# Patient Record
Sex: Male | Born: 2000 | Race: White | Hispanic: No | Marital: Single | Smoking: Never smoker
Health system: Southern US, Community
[De-identification: ages and names within clinical notes are randomized; demographics above are authoritative.]

## PROBLEM LIST (undated history)

## (undated) DIAGNOSIS — L309 Dermatitis, unspecified: Secondary | ICD-10-CM

## (undated) DIAGNOSIS — L509 Urticaria, unspecified: Secondary | ICD-10-CM

## (undated) DIAGNOSIS — J45990 Exercise induced bronchospasm: Secondary | ICD-10-CM

## (undated) HISTORY — DX: Exercise induced bronchospasm: J45.990

## (undated) HISTORY — DX: Dermatitis, unspecified: L30.9

## (undated) HISTORY — DX: Urticaria, unspecified: L50.9

---
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
## 2018-11-02 ENCOUNTER — Encounter: Payer: Self-pay | Admitting: Psychiatry

## 2018-11-02 ENCOUNTER — Ambulatory Visit (INDEPENDENT_AMBULATORY_CARE_PROVIDER_SITE_OTHER): Payer: 59 | Admitting: Psychiatry

## 2018-11-02 VITALS — BP 132/82 | HR 76 | Ht 66.0 in | Wt 159.0 lb

## 2018-11-02 DIAGNOSIS — F900 Attention-deficit hyperactivity disorder, predominantly inattentive type: Secondary | ICD-10-CM | POA: Diagnosis not present

## 2018-11-02 DIAGNOSIS — F411 Generalized anxiety disorder: Secondary | ICD-10-CM | POA: Diagnosis not present

## 2018-11-02 MED ORDER — METHYLPHENIDATE HCL ER (PM) 40 MG PO CP24: 40.0000 mg | ORAL_CAPSULE | Freq: Every day | ORAL | 0 refills | Status: DC

## 2018-11-02 MED ORDER — ESCITALOPRAM OXALATE 10 MG PO TABS: 10.0000 mg | ORAL_TABLET | Freq: Every day | ORAL | 1 refills | Status: DC

## 2018-11-02 NOTE — Progress Notes (Signed)
[facility]  Patient ID: Perry Frank,  MRN: [id number]  PCP: Maryellen Pileubin, David, MD  Date of Evaluation: 11/02/2018 Time spent:20 minutes  Chief Complaint:  Chief Complaint    Anxiety; ADHD      HISTORY/CURRENT STATUS: Perry Frank is seen conjointly with mother face-to-face with consent without other collateral for psychiatric interview and exam in 241-month evaluation and management of anxiety and mild ADHD.  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  Chemistry may have been his toughest course though he had the most difficulty with morning English at 0900 and the 1010 class as he sleeps late in the morning stating maybe his college bed is just too comfortable.  Mother sets her alarm to attempt to make sure he gets to [place] class as only so many absences are allowed before grade is reduced a letter.  He has otherwise adjusted to campus life with weight stable up 1 pound and regular exercise, though having a series of upper respiratory infections including sore throat and sinus still bothering him though treated with antibiotic in November.  He has been home several times and parents are pleased with his overall course of college participation.  Only since fall break has he tried Evekeo finding the first dose to feel and help great at 10 mg in the morning, but after that it seemed only help him be more alert for test such as finals as though he had had a good amount of caffeine.  Mother worries that he uses alcohol but he does not clarify specifically even though he and mother accept education on the use of alcohol in college when on either SSRI or stimulant.  Mother is much more confident in the patient than last appointment.  He does not have his truck at school but rather parents give him transportation back and forth.  Anxiety  Presents for follow-up visit. Symptoms include decreased concentration, dry mouth, excessive worry,  nervous/anxious behavior, obsessions and restlessness. Patient reports no panic or suicidal ideas. Symptoms occur most days. The most recent episode lasted 10 minutes. The severity of symptoms is mild. The patient sleeps 8 hours per night. The quality of sleep is good. Nighttime awakenings: occasional.   Compliance with medications is 51-75%.    Individual Medical History/ Review of Systems: Changes? :Yes   Some individuation separation is allowed by parents and accepted by patient for college life to become more adult maturity over time.  He has had allergic rhinitis, pharyngitis and sinusitis infections, and the at times burning out recovery systems status of college life for current primary care medical needs.  Allergies: Amoxicillin  Current Medications:  Current Outpatient Medications:  .  escitalopram (LEXAPRO) 10 MG tablet, Take 1 tablet (10 mg total) by mouth at bedtime., Disp: 90 tablet, Rfl: 1 .  acetaminophen (TYLENOL) 325 MG tablet, Take 650 mg by mouth every 6 (six) hours as needed., Disp: , Rfl:  .  albuterol (PROVENTIL HFA;VENTOLIN HFA) 108 (90 Base) MCG/ACT inhaler, Inhale 2 puffs every 4 (four) hours as needed into the lungs for wheezing or shortness of breath., Disp: 2 Inhaler, Rfl: 0 .  fexofenadine (ALLEGRA) 180 MG tablet, Take 180 mg by mouth as needed for allergies or rhinitis., Disp: , Rfl:  .  fluticasone (FLONASE) 50 MCG/ACT nasal spray, Place 2 sprays into both nostrils daily as needed for allergies or rhinitis., Disp: 16 g, Rfl: 5 .  Methylphenidate HCl ER, PM, (JORNAY PM) 40 MG CP24,  Take 40 mg by mouth at bedtime., Disp: 30 capsule, Rfl: 0 .  montelukast (SINGULAIR) 10 MG tablet, Take 1 tablet (10 mg total) by mouth at bedtime., Disp: 30 tablet, Rfl: 5 Medication Side Effects: none  Family Medical/ Social History: Changes? Yes as they appreciate his consequences from inattention particularly relative to morning classes and chemistry in college, limited appreciation  for Evekeo single prescription only started after fall break in this semester to predict better response with less worry for methylphenidate to follow.  C has difficulty arising in the morning and has some in earlier classes for spring semester, Ophelia Charter is considered.  MENTAL HEALTH EXAM: Strengths are 5/5, postural reflexes 0/0, and AIMS equals 0. Blood pressure 132/82, pulse 76, height 5\' 6"  (1.676 m), weight 159 lb (72.1 kg).Body mass index is 25.66 kg/m.  General Appearance: Casual, Fairly Groomed and Guarded  Eye Contact:  Fair  Speech:  Clear and Coherent  Volume:  Normal  Mood:  Anxious  Affect:  Constricted and Anxious  Thought Process:  Goal Directed  Orientation:  Full (Time, Place, and Person)  Thought Content: Obsessions and Rumination   Suicidal Thoughts:  No  Homicidal Thoughts:  No  Memory:  Immediate;   Fair Remote;   Good  Judgement:  Fair  Insight:  Fair  Psychomotor Activity:  Increased  Concentration:  Concentration: Good and Attention Span: Fair  Recall:  [employer] of Knowledge: Good  Language: Good  Assets:  Leisure Time Physical Health Resilience  ADL's:  Intact  Cognition: WNL  Prognosis:  Good    DIAGNOSES:    ICD-10-CM   1. Generalized anxiety disorder F41.1 escitalopram (LEXAPRO) 10 MG tablet  2. 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  We will discontinue  Evekeo and start instead Jorday as 40 mg every bedtime, being provided a coupon for first fill #30 with [facility].  Additionally his Lexapro is continued 10 mg nightly sent as #90 with 1 refill, mother suggesting delivery and coordination with on campus helps though parents mainly do the transporting. extensive preparation for  winter/spring semester is underway including in the session today to return in 6 months.  They will contact me for E scribing methylphenidate if tolerated, beneficial and willing to continue.   Chauncey Mann, MD

## 2018-11-07 ENCOUNTER — Ambulatory Visit: Payer: Self-pay | Admitting: Psychiatry

## 2018-11-09 ENCOUNTER — Telehealth: Payer: Self-pay | Admitting: Psychiatry

## 2018-11-09 ENCOUNTER — Other Ambulatory Visit: Payer: Self-pay | Admitting: Psychiatry

## 2018-11-09 ENCOUNTER — Ambulatory Visit: Payer: Self-pay | Admitting: Psychiatry

## 2018-11-09 DIAGNOSIS — F900 Attention-deficit hyperactivity disorder, predominantly inattentive type: Secondary | ICD-10-CM

## 2018-11-09 MED ORDER — METHYLPHENIDATE HCL ER (PM) 40 MG PO CP24: 40.0000 mg | ORAL_CAPSULE | Freq: Every day | ORAL | 0 refills | Status: DC

## 2018-11-09 NOTE — Progress Notes (Signed)
Mother notes that Perry Frank [facility] not be [facility] [address] requesting it be E scribed as [facility] prescription [facility] [address] instead.  The 11/02/2018 prescription was not filled and is voided, while the prescription printed initially from epic here is voided and E scribe sent [facility] [address] medically necessary with no contraindication.

## 2018-11-09 NOTE — Telephone Encounter (Signed)
PT MOTHER SUE STATED [facility] [address] THEY DO NOT HAVE THE METHYLPHENIDATE IN STOCK PLEASE  SEND SCRIPT TO [facility] AT [address]

## 2019-01-08 ENCOUNTER — Encounter: Payer: Self-pay | Admitting: Emergency Medicine

## 2019-01-15 ENCOUNTER — Other Ambulatory Visit: Payer: Self-pay

## 2019-01-15 ENCOUNTER — Telehealth: Payer: Self-pay | Admitting: Psychiatry

## 2019-01-15 DIAGNOSIS — F900 Attention-deficit hyperactivity disorder, predominantly inattentive type: Secondary | ICD-10-CM

## 2019-01-15 MED ORDER — METHYLPHENIDATE HCL ER (PM) 40 MG PO CP24: 40.0000 mg | ORAL_CAPSULE | Freq: Every day | ORAL | 0 refills | Status: DC

## 2019-01-15 NOTE — Telephone Encounter (Signed)
Son is in college and needs refill of Korea. Mother wants to talk to you about which pharmacy will be best.

## 2019-01-15 NOTE — Progress Notes (Signed)
Patient at college needing another refill of Jornay 40 mg nightly medically necessary with no contraindication #30 with no refill sent [facility]

## 2019-01-15 NOTE — Progress Notes (Signed)
Mom requesting refill for Ophelia Charter [facility] [place], [place] pt at college. [address] [place] [address]  Last appointment 11/09/2018 necessitated first fill changing pharmacies for lack of supply now doing 30 to the [facility] as mother requests, medically necessary with no contraindication.

## 2019-01-15 NOTE — Telephone Encounter (Signed)
[facility] [place] [place]

## 2019-04-19 ENCOUNTER — Ambulatory Visit
Admission: RE | Admit: 2019-04-19 | Discharge: 2019-04-19 | Disposition: A | Discharge status: Home / Self Care | Payer: 59 | Source: Ambulatory Visit | Attending: Pediatrics | Admitting: Pediatrics

## 2019-04-19 ENCOUNTER — Other Ambulatory Visit: Payer: Self-pay | Admitting: Pediatrics

## 2019-04-19 DIAGNOSIS — R509 Fever, unspecified: Secondary | ICD-10-CM

## 2019-05-07 ENCOUNTER — Other Ambulatory Visit: Payer: Self-pay

## 2019-05-07 ENCOUNTER — Encounter: Payer: Self-pay | Admitting: Psychiatry

## 2019-05-07 ENCOUNTER — Ambulatory Visit (INDEPENDENT_AMBULATORY_CARE_PROVIDER_SITE_OTHER): Payer: 59 | Admitting: Psychiatry

## 2019-05-07 VITALS — Ht 67.0 in | Wt 138.0 lb

## 2019-05-07 DIAGNOSIS — F411 Generalized anxiety disorder: Secondary | ICD-10-CM

## 2019-05-07 DIAGNOSIS — F900 Attention-deficit hyperactivity disorder, predominantly inattentive type: Secondary | ICD-10-CM | POA: Diagnosis not present

## 2019-05-07 MED ORDER — JORNAY PM 40 MG PO CP24: 40.0000 mg | ORAL_CAPSULE | Freq: Every day | ORAL | 0 refills | Status: DC

## 2019-05-07 MED ORDER — ESCITALOPRAM OXALATE 10 MG PO TABS: 10.0000 mg | ORAL_TABLET | Freq: Every day | ORAL | 1 refills | Status: DC

## 2019-05-07 NOTE — Progress Notes (Signed)
[facility]  Patient ID: Perry Frank,  MRN: [id number]  PCP: Maryellen Pileubin, David, MD  Date of Evaluation: 05/07/2019 Time spent:20 minutes from 0900 to 0920  Chief Complaint:  Chief Complaint    Anxiety; ADHD      HISTORY/CURRENT STATUS: Perry Frank is seen individually and conjointly with mother face-to-face with consent on site in office with epic collateral for adolescent psychiatric interview and exam in 3944-month evaluation and management of ADHD comorbid with generalized anxiety both undermining delayed progression of youth to adulthood development with multiple legal, academic, and community consequences in the past.  The patient considers mother to be overwhelmed herself and exacerbating the consequences for his impulsive risk-taking errors of the past.  The patient needs to enhance his prevention and protective factors in order to be more successful.  [employer] closed after spring break with patient including grades were all B's and second semester with 1 of those B's 0.51 low from A.  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  He has lost 21 pounds with recent febrile illness [facility] possibly be [facility] scratch fever.  He continues Lexapro he has lost his supply at home the last week without informing mother so that he is more anxious but has not had discontinuation symptoms.  Has no mania, suicidality, psychosis, or substance use of significance though mother knows he does use some alcohol at college.  He did not do well with [place] in the past working effectively for only 1 day then disorganizing his sleep and enhanced focus.  Anxiety  Presents for follow-up visit. Symptoms include decreased concentration, dry mouth, excessive worry, nervous/anxious behavior, obsessions and restlessness.  Patient reports no panic or suicidal ideas. Symptoms occur most days. The most recent episode lasted 90 miinutes. The severity of symptoms is mild to severe. The patient sleeps 6 hours per night. The quality of sleep is fair. Nighttime awakenings: occasional.  Compliance with medications is 51-75%.   Individual Medical History/ Review of Systems: Changes? :Yes Febrile leukocytosis with lymphadenopathy has resulted in hospitalization considering lymphoma but now considering the diagnosis cat scratch fever as they continue to analyze lymph node biopsy.  He has lost 21 pounds in the process is not taking [place] since at home from college and continue his Lexapro until he left the hospital and lost his supply.  Zithromax did seem to help with no adverse effects.  Allergies: Amoxicillin  Current Medications:  Current Outpatient Medications:  .  acetaminophen (TYLENOL) 325 MG tablet, Take 650 mg by mouth every 6 (six) hours as needed., Disp: , Rfl:  .  albuterol (PROVENTIL HFA;VENTOLIN HFA) 108 (90 Base) MCG/ACT inhaler, Inhale 2 puffs every 4 (four) hours as needed into the lungs for wheezing or shortness of breath., Disp: 2 Inhaler, Rfl: 0 .  Amphetamine Sulfate (EVEKEO) 10 MG TABS, Take 10 mg by mouth every morning., Disp: , Rfl:  .  escitalopram (LEXAPRO) 10 MG tablet, Take 1 tablet (10 mg total) by mouth at bedtime., Disp: 90 tablet, Rfl: 1 .  fexofenadine (ALLEGRA) 180 MG tablet, Take 180 mg by mouth as needed for allergies or rhinitis., Disp: , Rfl:  .  fluticasone (FLONASE) 50 MCG/ACT nasal spray, Place 2 sprays into both nostrils daily as needed for allergies or rhinitis., Disp: 16 g, Rfl: 5 .  Methylphenidate HCl ER, PM, (JORNAY PM) 40 MG CP24, Take 40 mg by mouth at bedtime., Disp: 30 capsule, Rfl: 0 .  montelukast (SINGULAIR) 10 MG tablet, Take 1 tablet (10 mg total) by mouth at bedtime., Disp: 30 tablet, Rfl: 5   Medication Side Effects: none  Family Medical/ Social History: Changes?  No  MENTAL HEALTH EXAM:  Height 5\' 7"  (1.702 m), weight 138 lb (62.6 kg).Body mass index is 21.61 kg/m.  deferred for coronavirus pandemic  General Appearance: Casual, Fairly Groomed and Guarded  Eye Contact:  Fair  Speech:  Blocked, Clear and Coherent and Normal Rate  Volume:  Normal  Mood:  Anxious, Euthymic, Irritable and Worthless  Affect:  Inappropriate, Labile, Full Range and Anxious  Thought Process:  Disorganized, Irrelevant and Linear  Orientation:  Full (Time, Place, and Person)  Thought Content: Ilusions, Obsessions, Paranoid Ideation and Rumination   Suicidal Thoughts:  No  Homicidal Thoughts:  No  Memory:  Immediate;   Good Remote;   Good  Judgement:  Impaired  Insight:  Fair and Lacking  Psychomotor Activity:  Increased, Decreased, Mannerisms and Restlessness  Concentration:  Concentration: Fair and Attention Span: Poor  Recall:  [employer] of Knowledge: Fair  Language: Fair  Assets:  Desire for Improvement Leisure Time Resilience Talents/Skills  ADL's:  Intact  Cognition: WNL  Prognosis:  Fair    DIAGNOSES:    ICD-10-CM   1. Generalized anxiety disorder  F41.1   2. 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  He will the need to restart Jornay as weight loss from cat scratch fever is being resolved and return to school processed and prepared for [employer] sophomore year.  Therefore Jornay 40 mg every bedtime for #30 no refill is sent to [place] patient only remembering take his medicine as he remembers the Lexapro and associates with overwhelming responsibilities of the next day.  Patient and mother collaborate better than ever in the session though patient prepares himself by discussing mother's  over reactivity as generalized anxiety like that of his own prior to the conjoint session.  He does not get up and function in the morning without the [place] a at night.  Follow-up is in 6 months.   Delight Hoh, MD

## 2019-05-10 ENCOUNTER — Telehealth: Payer: Self-pay

## 2019-05-10 NOTE — Telephone Encounter (Signed)
Prior authorization is again completed and appreciated by office and family as pharmacy and insurance do not clarify the utility or etiology.

## 2019-05-10 NOTE — Telephone Encounter (Signed)
Prior authorization submitted through [facility] for Jornay PM ER 40 mg approved effective 05/10/2019-05/09/2020

## 2019-06-11 ENCOUNTER — Ambulatory Visit: Payer: Self-pay | Admitting: General Surgery

## 2019-10-30 ENCOUNTER — Encounter: Payer: Self-pay | Admitting: Psychiatry

## 2019-10-30 ENCOUNTER — Ambulatory Visit (INDEPENDENT_AMBULATORY_CARE_PROVIDER_SITE_OTHER): Payer: 59 | Admitting: Psychiatry

## 2019-10-30 ENCOUNTER — Other Ambulatory Visit: Payer: Self-pay

## 2019-10-30 VITALS — Ht 67.0 in | Wt 171.0 lb

## 2019-10-30 DIAGNOSIS — F900 Attention-deficit hyperactivity disorder, predominantly inattentive type: Secondary | ICD-10-CM

## 2019-10-30 DIAGNOSIS — F411 Generalized anxiety disorder: Secondary | ICD-10-CM

## 2019-10-30 MED ORDER — JORNAY PM 40 MG PO CP24: 40.0000 mg | ORAL_CAPSULE | Freq: Every day | ORAL | 0 refills | Status: DC

## 2019-10-30 MED ORDER — ESCITALOPRAM OXALATE 10 MG PO TABS: 10.0000 mg | ORAL_TABLET | Freq: Every day | ORAL | 3 refills | Status: DC

## 2019-10-30 NOTE — Progress Notes (Signed)
[facility]  Patient ID: Perry Frank,  MRN: [id number]  PCP: Karleen Dolphin, MD  Date of Evaluation: 10/30/2019 Time spent:20 minutes from 0900 to 0920  Chief Complaint:  Chief Complaint    Anxiety; ADHD      HISTORY/CURRENT STATUS: Perry Frank is seen onsite in office 20 minutes face-to-face with consent with epic collateral individually and conjointly with mother for psychiatric interview and exam in 20-month evaluation and management of generalized anxiety and inattentive ADHD.  Weyman knows of no behavioral problems in the interim though he thinks mother still thinks he drinks too much beer.  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  However mother is preparing for his return to the academic semester at [employer] next month.  Patient has significantly struggled remembering to take his medication at college similar to struggling to stay out of trouble such as with beer having his ADHD impulsivity and his generalized anxiety for which she remains avoidant of change.  He takes his Lexapro regularly 10 mg nightly but has not been taking his Jornay  in the interim.  He has a 16-hour semester next at [employer] and will need his Oneida Alar again to have any chance of restoring his academic competence.  Otherwise his health is back being good.  Mother asked about [employer] today it is explained for medical parameters, genetic data, and utilities.  They do want to have Jorney on hand for his college semester expecting needed as he returns.  He has no mania, suicidality, psychosis or delirium.  Anxiety  Presents forfollow-upvisit. Symptoms includedecreased concentration,dry  mouth,excessive worry,nervous/anxious behavior,avoidance, inhibition, obsessionsand restlessness. Patient reports nopanicor suicidal ideas. Symptoms occurmost days. The most recent episode lasted 90 miinutes. The severity of symptoms ismild to severe. The patient sleeps6 hoursper night. The quality of sleep isfair. Nighttime awakenings:occasional. Compliance with medications is26-50%.  Individual Medical History/ Review of Systems: Changes? :Yes Weight is up 12 pounds over that of 1 year ago though he had lost the 1 pounds during his febrile leukocytosis and lymphadenopathy syndrome worked up inpatient at [facility] last May so that essentially his net weight gain of 12 pounds in the last year is adult maturity having 20 year to reach 18 years of age.  [place] registry has no entries.  Allergies: Amoxicillin  Current Medications:  Current Outpatient Medications:  .  acetaminophen (TYLENOL) 325 MG tablet, Take 650 mg by mouth every 6 (six) hours as needed., Disp: , Rfl:  .  albuterol (PROVENTIL HFA;VENTOLIN HFA) 108 (90 Base) MCG/ACT inhaler, Inhale 2 puffs every 4 (four) hours as needed into the lungs for wheezing or shortness of breath., Disp: 2 Inhaler, Rfl: 0 .  Amphetamine Sulfate (EVEKEO) 10 MG TABS, Take 10 mg by mouth every morning., Disp: , Rfl:  .  escitalopram (LEXAPRO) 10 MG tablet, Take 1 tablet (10 mg total) by mouth at bedtime., Disp: 90 tablet, Rfl: 3 .  fexofenadine (ALLEGRA) 180 MG tablet, Take 180 mg by mouth as needed for allergies or rhinitis., Disp: , Rfl:  .  fluticasone (FLONASE) 50 MCG/ACT nasal spray, Place 2 sprays into both nostrils daily as needed for allergies or rhinitis., Disp: 16 g, Rfl: 5 .  Methylphenidate HCl ER, PM, (JORNAY PM) 40  MG CP24, Take 40 mg by mouth at bedtime., Disp: 30 capsule, Rfl: 0 .  [START ON 11/29/2019] Methylphenidate HCl ER, PM, (JORNAY PM) 40 MG CP24, Take 40 mg by mouth at bedtime., Disp: 30 capsule, Rfl: 0 .  [START ON 12/29/2019]  Methylphenidate HCl ER, PM, (JORNAY PM) 40 MG CP24, Take 40 mg by mouth at bedtime., Disp: 30 capsule, Rfl: 0 .  montelukast (SINGULAIR) 10 MG tablet, Take 1 tablet (10 mg total) by mouth at bedtime., Disp: 30 tablet, Rfl: 5 Medication Side Effects: none  Family Medical/ Social History: Changes? No  MENTAL HEALTH EXAM:  Height 5\' 7"  (1.702 m), weight 171 lb (77.6 kg).Body mass index is 26.78 kg/m. Muscle strengths and tone 5/5, postural reflexes and gait 0/0, and AIMS = 0 otherwise deferred for coronavirus shutdown  General Appearance: Casual, Fairly Groomed, Guarded and Meticulous  Eye Contact:  Fair  Speech:  Blocked, Clear and Coherent, Slow and Talkative  Volume:  Normal  Mood:  Anxious, Euthymic and Worthless  Affect:  Congruent, Inappropriate, Restricted and Anxious  Thought Process:  Coherent, Goal Directed, Irrelevant, Linear and Descriptions of Associations: Tangential  Orientation:  Full (Time, Place, and Person)  Thought Content: Illogical, Ilusions, Rumination and Tangential   Suicidal Thoughts:  No  Homicidal Thoughts:  No  Memory:  Immediate;   Fair Remote;   Fair  Judgement:  Fair  Insight:  Fair and Lacking  Psychomotor Activity:  Normal, Mannerisms and Restlessness  Concentration:  Concentration: Fair and Attention Span: Poor  Recall:  of Knowledge: Good  Language: Good  Assets:  Leisure Time Physical Health Talents/Skills  ADL's:  Intact  Cognition: WNL  Prognosis:  Fair    DIAGNOSES:    ICD-10-CM   1. Generalized anxiety disorder  F41.1 escitalopram (LEXAPRO) 10 MG tablet  2. 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  He has documented on/off benefit from the [place], and he lacks the maturity and executive function application to college education so that restarting the [place] is necessary.  Psycho supportive psychoeducation addresses behavioral nutrition, sleep hygiene, social and academic consistency, and frustration management in the context of symptom treatment matching for Ophelia Charter a 40 mg and Lexapro 10 mg. He is E scribed Lexapro 10 mg every bedtime sent as #90 with 3 refills for generalized anxiety disorder and he is E scribed Jorday 40 mg every bedtime sent as #30 each for December 8, January 7, and February 6 for ADHD both to [facility] [place].  He returns for follow-up in 6 to 12 months or sooner if needed.  Individuation separation maturation and functional capacity continue to be behaviorally worked on in college.   AURA, MD

## 2019-11-05 ENCOUNTER — Telehealth: Payer: Self-pay

## 2019-11-05 NOTE — Telephone Encounter (Signed)
Pharmacy and family have expressed hope that return to [place] can include Oneida Alar now medically possible from provider standpoint if patient and family apply the opportunity.

## 2019-11-05 NOTE — Telephone Encounter (Signed)
Prior authorization submitted through cover my meds for Jornay PM, approved effective 11/02/2019-11/01/2020 with [facility].

## 2020-03-11 ENCOUNTER — Telehealth: Payer: Self-pay | Admitting: Psychiatry

## 2020-03-11 ENCOUNTER — Other Ambulatory Visit: Payer: Self-pay

## 2020-03-11 DIAGNOSIS — F900 Attention-deficit hyperactivity disorder, predominantly inattentive type: Secondary | ICD-10-CM

## 2020-03-11 MED ORDER — JORNAY PM 40 MG PO CP24: 40.0000 mg | ORAL_CAPSULE | Freq: Every day | ORAL | 0 refills | Status: DC

## 2020-03-11 NOTE — Telephone Encounter (Signed)
Pt would like a refill on Jornay 40mg  enough to last him until his next appt in June. Please send to [facility] at [address], [place].

## 2020-03-11 NOTE — Telephone Encounter (Signed)
Patient has filled 2 of the 3 eScription's for Jornay 40 mg nightly from December last filled in February as per [facility] registry taking eScription to go to the [facility] in [place] where he is in college, no need to cancel the remaining eScription at the local pharmacy can only send a 1 month supply even though he wants or than that to the [facility] as he is not being seen in the office today

## 2020-04-28 ENCOUNTER — Ambulatory Visit: Payer: 59 | Admitting: Psychiatry

## 2020-05-05 ENCOUNTER — Other Ambulatory Visit: Payer: Self-pay

## 2020-05-05 ENCOUNTER — Encounter: Payer: Self-pay | Admitting: Psychiatry

## 2020-05-05 ENCOUNTER — Ambulatory Visit (INDEPENDENT_AMBULATORY_CARE_PROVIDER_SITE_OTHER): Payer: 59 | Admitting: Psychiatry

## 2020-05-05 VITALS — Ht 67.0 in | Wt 178.0 lb

## 2020-05-05 DIAGNOSIS — F900 Attention-deficit hyperactivity disorder, predominantly inattentive type: Secondary | ICD-10-CM

## 2020-05-05 DIAGNOSIS — F411 Generalized anxiety disorder: Secondary | ICD-10-CM | POA: Diagnosis not present

## 2020-05-05 NOTE — Progress Notes (Signed)
[facility]  Patient ID: Kendra Woolford,  MRN: [id number]  PCP: Karleen Dolphin, MD  Date of Evaluation: 05/05/2020 Time spent:20 minutes from 50 to 0940  Chief Complaint:  Chief Complaint    Anxiety; ADHD      HISTORY/CURRENT STATUS: Perry Frank is seen onsite in office 20 minutes face-to-face individually mother at another appointment with consent with epic collateral for adolescent psychiatric interview and exam in 51-month evaluation and management of generalized anxiety and mild ADHD.  In the interim, the patient failed statistics class while taking several other math courses at the same time upsetting parents, though having a mechanism to repeat and make up the failure careful to select the right teacher and time.  Otherwise he is doing reasonably well and joined the [employer] already meeting stating he wanted to figure things out.  He will not give a GPA but did advance to the junior year starting this August.  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  He has otherwise remained out of trouble despite having college access to alcohol with functions socially and vaping occasionally.  He has 5 years of Lexapro 10 mg nightly finding it helpful, and Jornay on weekdays for school is still helpful at 40 mg nightly, taking less in the summer.  Last dispensing of Jornay per [place] registry was [facility]/11/2020 [facility] [address] in [place] of which he has half of the supply remaining.  He notes his mother has 2 bottles of Lexapro, and they have sufficient confidence in his request to reduce the medication now 5 years of treatment and doing much better in regard to generalized anxiety and impulse control.  He has no mania, suicidality, psychosis or delirium.  Anxiety             Presents forfollow-upvisit. Symptoms includedecreased concentration,dry mouth,excessive worry, inhibition, obsessive  retentiveness,and restlessness. Patient reports nopanic, nervous/anxious behavior,avoidanceor suicidal ideas. Symptoms occurmsome days. The longest recent episode lasted an hour.The severity of symptoms ismildto severe. The patient sleeps6hoursper night. The quality of sleep isfair. Nighttime awakenings:occasional. Compliance with medications is51-76%.  Individual Medical History/ Review of Systems: Changes? :No Except weight is up 7 pounds in 6 months  Allergies: Amoxicillin  Current Medications:  Current Outpatient Medications:  .  acetaminophen (TYLENOL) 325 MG tablet, Take 650 mg by mouth every 6 (six) hours as needed., Disp: , Rfl:  .  albuterol (PROVENTIL HFA;VENTOLIN HFA) 108 (90 Base) MCG/ACT inhaler, Inhale 2 puffs every 4 (four) hours as needed into the lungs for wheezing or shortness of breath., Disp: 2 Inhaler, Rfl: 0 .  escitalopram (LEXAPRO) 10 MG tablet, Take 0.5 tablets (5 mg total) by mouth at bedtime., Disp: 90 tablet, Rfl: 3 .  fexofenadine (ALLEGRA) 180 MG tablet, Take 180 mg by mouth as needed for allergies or rhinitis., Disp: , Rfl:  .  fluticasone (FLONASE) 50 MCG/ACT nasal spray, Place 2 sprays into both nostrils daily as needed for allergies or rhinitis., Disp: 16 g, Rfl: 5 .  Methylphenidate HCl ER, PM, (JORNAY PM) 40 MG CP24, Take 40 mg by mouth at bedtime., Disp: 30 capsule, Rfl: 0 .  Methylphenidate HCl ER, PM, (JORNAY PM) 40 MG CP24, Take 40 mg by mouth at bedtime., Disp: 30 capsule, Rfl: 0 .  Methylphenidate HCl ER, PM, (JORNAY PM) 40 MG CP24, Take 40 mg by mouth at bedtime., Disp: 30 capsule, Rfl: 0 .  montelukast (SINGULAIR) 10  MG tablet, Take 1 tablet (10 mg total) by mouth at bedtime., Disp: 30 tablet, Rfl: 5  Medication Side Effects: none  Family Medical/ Social History: Changes? No  MENTAL HEALTH EXAM:  Height 5\' 7"  (1.702 m), weight 178 lb (80.7 kg).Body mass index is 27.88 kg/m. Muscle strengths and tone 5/5, postural reflexes and gait 0/0,  and AIMS = 0.  General Appearance: Casual, Fairly Groomed and Meticulous  Eye Contact:  Fair  Speech:  Clear and Coherent, Normal Rate and Talkative  Volume:  Normal  Mood:  Anxious and Euthymic  Affect:  Congruent, Inappropriate, Restricted and Anxious  Thought Process:  Coherent, Goal Directed, Irrelevant, Linear and Descriptions of Associations: Tangential  Orientation:  Full (Time, Place, and Person)  Thought Content: Rumination and Tangential   Suicidal Thoughts:  No  Homicidal Thoughts:  No  Memory:  Immediate;   Good and Fair Remote;   Fair  Judgement:  Good  Insight:  Fair  Psychomotor Activity:  Normal and Mannerisms  Concentration:  Concentration: Fair and Attention Span: Fair  Recall:  of Knowledge: Good  Language: Good  Assets:  Desire for Improvement Physical Health Social Support Vocational/Educational  ADL's:  Intact  Cognition: WNL  Prognosis:  Fair    DIAGNOSES:    ICD-10-CM   1. Generalized anxiety disorder  F41.1 escitalopram (LEXAPRO) 10 MG tablet  2. 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  At this time patient and family agree historically per patient to taper the Lexapro and continue the [employer] without change.  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  [place] is continued 40 mg every bedtime for ADHD.  Lexapro is reduced as 10 mg tablet to 1/2 tablet total 5 mg every bedtime for generalized anxiety and impulse control  having adequate supply of Lexapro. He returns here in 6 months or sooner if needed.  Ophelia Charter, MD

## 2020-05-19 ENCOUNTER — Encounter: Payer: Self-pay | Admitting: Psychiatry

## 2020-08-25 IMAGING — DX CHEST - 2 VIEW
2 series · 2 of 2 positions shown · non-contrast
Comparison: None.

CLINICAL DATA: Cough, fever, congestion

EXAM:
CHEST - 2 VIEW

[dg chest 2 view (1 of 2)]
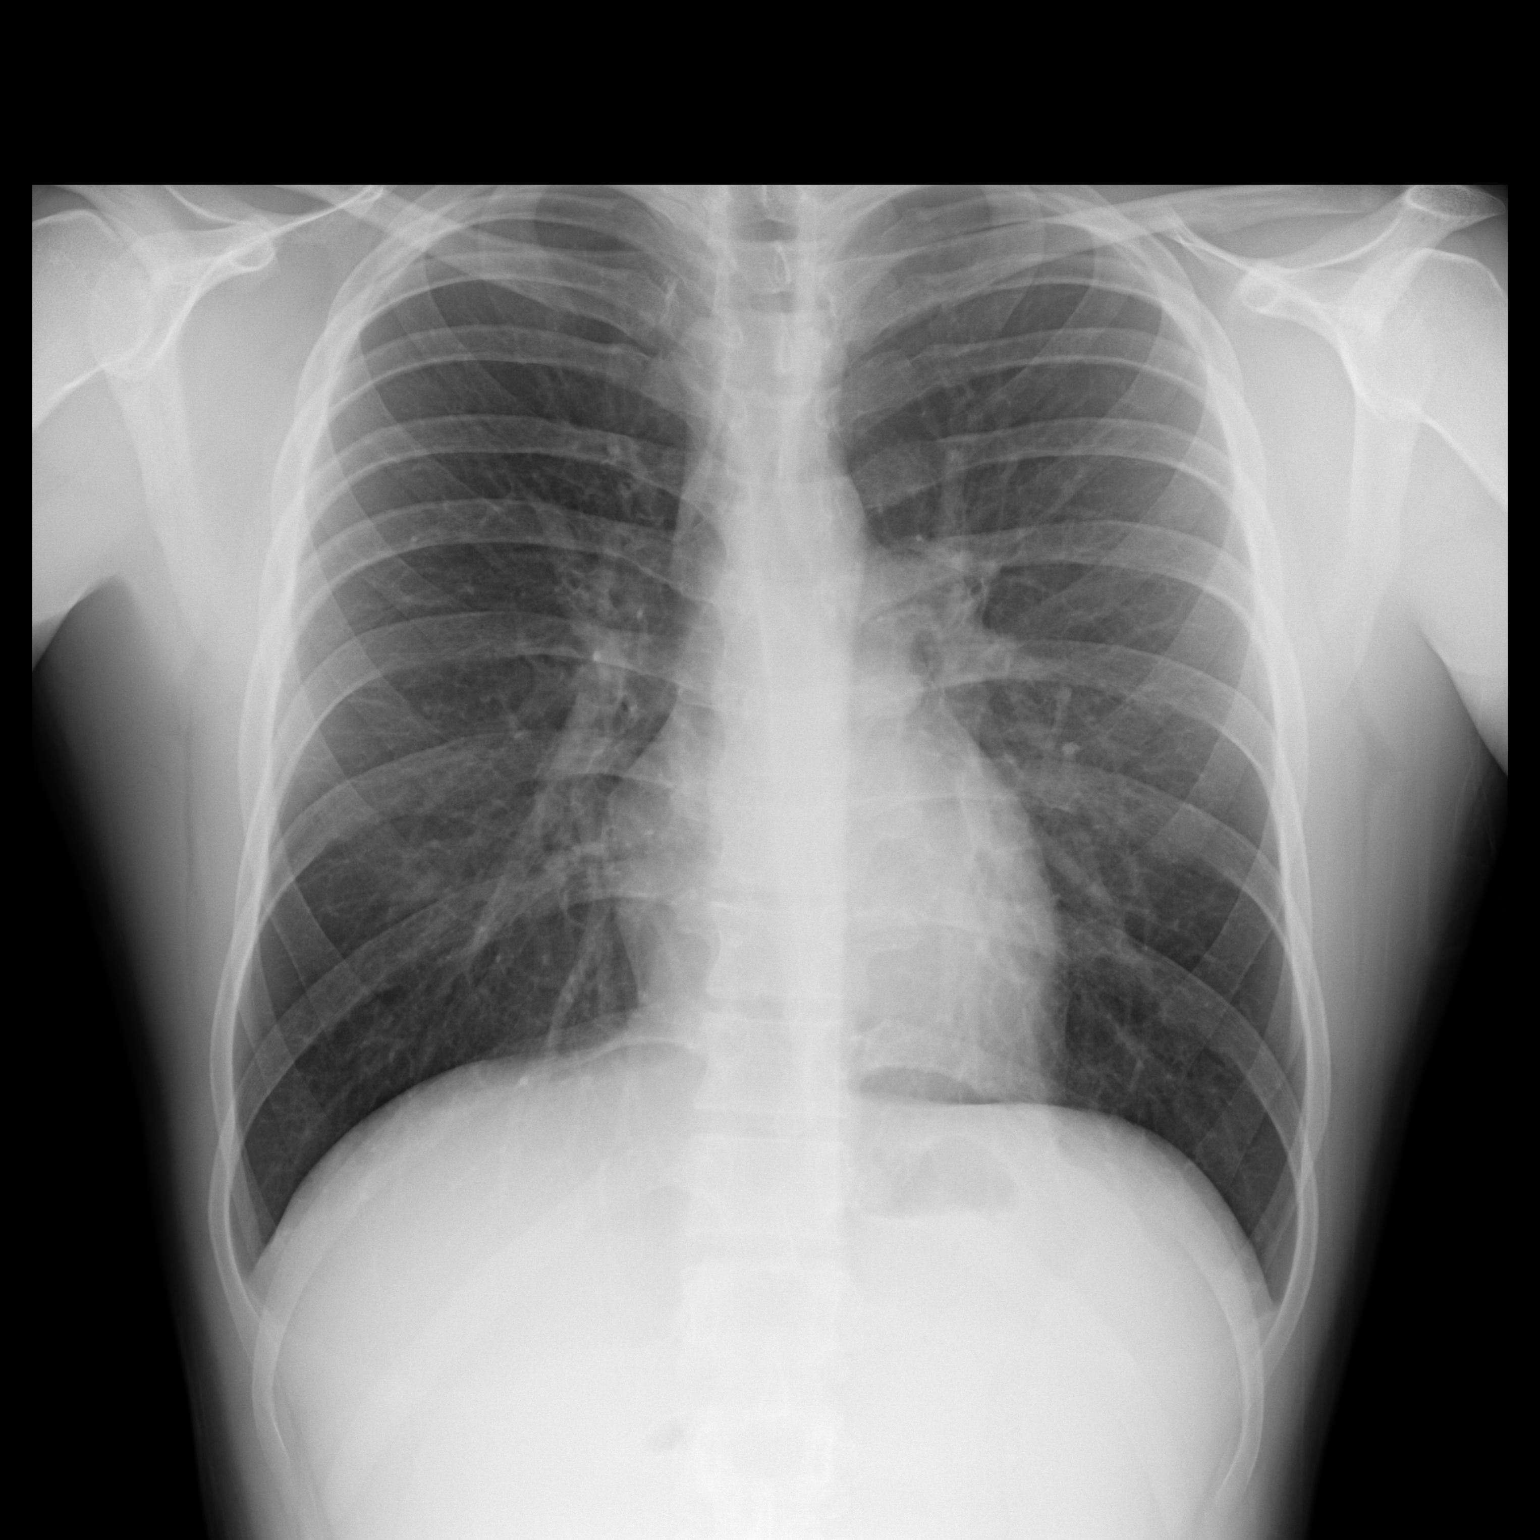

[dg chest 2 view (2 of 2)]
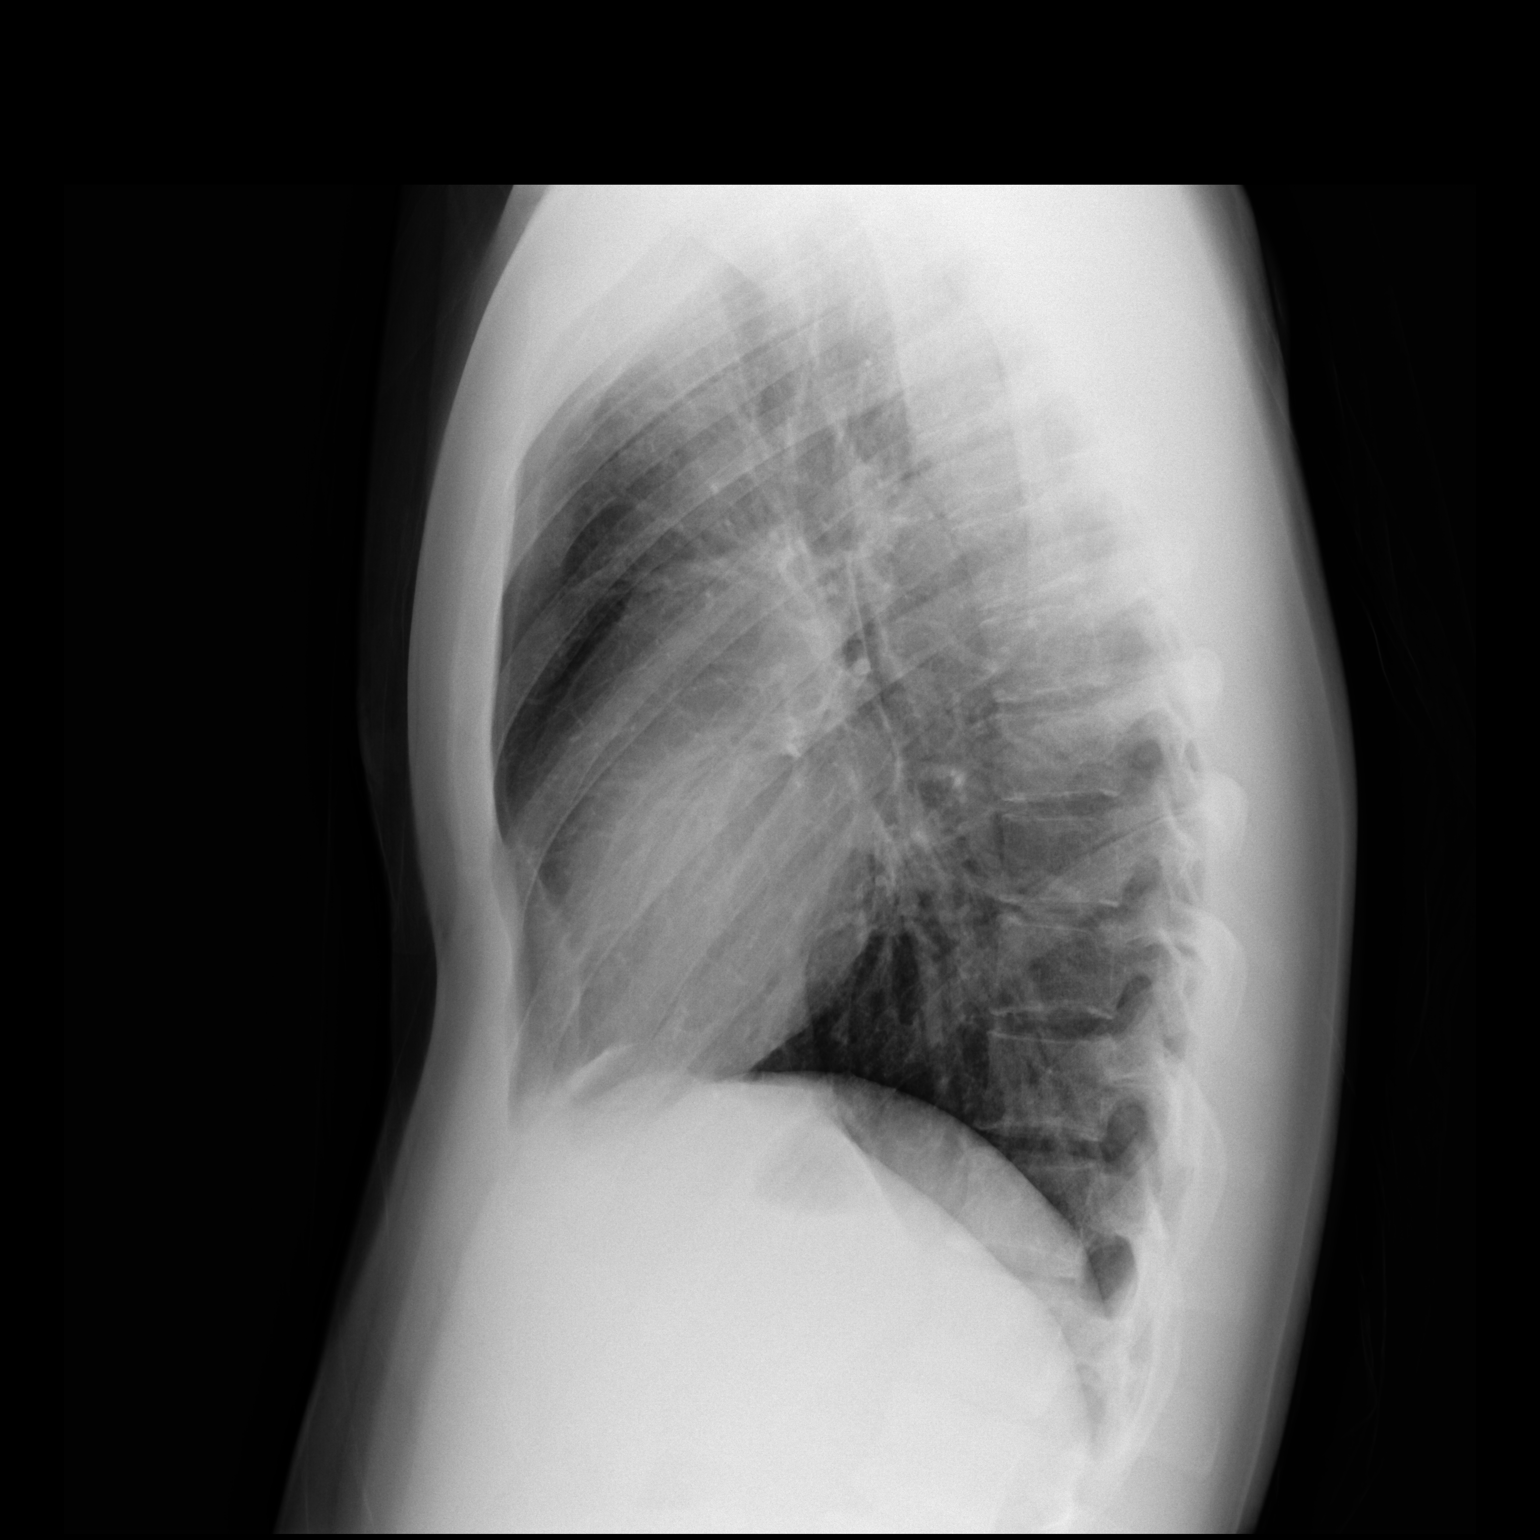

[2 of 2 positions shown; findings below may reference images not displayed]

FINDINGS: The heart size and mediastinal contours are within normal limits.
Both lungs are clear. The visualized skeletal structures are
unremarkable.
IMPRESSION: No acute abnormality of the lungs.

## 2020-09-10 ENCOUNTER — Encounter: Payer: Self-pay | Admitting: Psychiatry

## 2020-10-10 ENCOUNTER — Other Ambulatory Visit: Payer: Self-pay | Admitting: Psychiatry

## 2020-10-10 DIAGNOSIS — F411 Generalized anxiety disorder: Secondary | ICD-10-CM

## 2020-11-04 ENCOUNTER — Encounter (INDEPENDENT_AMBULATORY_CARE_PROVIDER_SITE_OTHER): Payer: Self-pay

## 2020-11-04 ENCOUNTER — Encounter: Payer: Self-pay | Admitting: Psychiatry

## 2020-11-04 ENCOUNTER — Ambulatory Visit (INDEPENDENT_AMBULATORY_CARE_PROVIDER_SITE_OTHER): Payer: 59 | Admitting: Psychiatry

## 2020-11-04 ENCOUNTER — Other Ambulatory Visit: Payer: Self-pay

## 2020-11-04 VITALS — Ht 67.0 in | Wt 190.0 lb

## 2020-11-04 DIAGNOSIS — F411 Generalized anxiety disorder: Secondary | ICD-10-CM | POA: Diagnosis not present

## 2020-11-04 DIAGNOSIS — F900 Attention-deficit hyperactivity disorder, predominantly inattentive type: Secondary | ICD-10-CM | POA: Diagnosis not present

## 2020-11-04 MED ORDER — ESCITALOPRAM OXALATE 5 MG PO TABS: 5.0000 mg | ORAL_TABLET | Freq: Every day | ORAL | 2 refills | Status: DC

## 2020-11-04 NOTE — Progress Notes (Signed)
[facility]  Patient ID: Perry Frank,  MRN: [id number]  PCP: Maryellen Pile, MD  Date of Evaluation: 11/04/2020 Time spent:20 minutes from 0925 to 0945  Chief Complaint:  Chief Complaint    Anxiety; ADHD      HISTORY/CURRENT STATUS: Perry Frank is seen onsite in office 20 minutes face-to-face individually and conjointly with mother arriving 5 minutes late as previously for psychiatric interview and exam in 49-month evaluation and management of generalized anxiety disorder and ADHD.  The patient and mother have always maintained ambivalence and triangulation in the patient's 5-1/2 years of care here as referred by psychotherapist Jarvis Morgan, [facility], though patient is no longer having therapy.  Mother expected the patient's evaluations and interventions here at the office to resolve his impulse dyscontrol for acting out in careless and risk-taking ways through high school despite not implementing family changes, though patient is doing much better at [employer] other than episodically underachieving academically.  The patient's compliance with medication is similarly marginal to poor expecting mother to allow him to assume full management but then not following through himself with adjustments made and responsible use of the medication.  He has in the interim gained weight not taking [place] with last dispensing per [place] registry being 03/11/2020 stating he is working out gaining muscle weight in the gym.  The patient planned to discontinue Jornay for the summer still having a couple week supply for the start of his junior year at [employer], but he has not taken it this semester.  His grades are A's and B's denying any failures this semester as he starts his second semester of junior year in January.  The patient concludes he no longer needs the [place] as noncompliance may be at least partly associated with his perceived resolution of need for the medication.  Similarly with Lexapro, he  intended to take the remaining supply at last appointment 6 months ago at a reduced dose 10 mg tablet to take 1/2 tablet or 5 mg every bedtime.  However the patient explains today including to mother that he never made the adjustment but just continued the 10 mg nightly such that he needed another eScription in November from this office receiving a 90-day supply of the 10 mg he has continued, and all conclude to complete this remaining supply of the 10 mg before tapering to 5 mg then 2.5 mg nightly at least one month each to likely stop the medication in the spring of his junior year second semester.  However they wish to continue follow-up here including off the medication to assess with advanced practitioner any further needs for his senior year as case closure by myself for imminent retirement is processed and completed.  He has no mania, suicidality, psychosis or delirium.  Anxiety Presents forfollow-upvisit. Symptoms includedecreased concentration,impulse dyscontrol,excessive worry, anxious ambivalent inhibition,and obsessive retentiveness slowness. Patient reports nopanic, nervous/anxious behavior,social avoidance, mania, delusion,or suicidal ideas. Symptoms occurmsome days.The severity of symptoms isnow mildthough remotely episodically severe. The patient sleeps6hoursper night. The quality of sleep isfair. Nighttime awakenings:occasional. Compliance with medications is51-76%.  Individual Medical History/ Review of Systems: Changes? :Yes 12 pound weight gain in 6 months patient attributes to muscle building in the gym.  He had an insect bite become complicated by cellulitis in August and apparently hematology reviewed his previous febrile lymphadenopathy as of November with no further documentation of concern.  Allergies: Amoxicillin  Current Medications:  Current Outpatient Medications:  .  acetaminophen (TYLENOL) 325 MG tablet, Take 650 mg by mouth  every 6 (six)  hours as needed., Disp: , Rfl:  .  albuterol (PROVENTIL HFA;VENTOLIN HFA) 108 (90 Base) MCG/ACT inhaler, Inhale 2 puffs every 4 (four) hours as needed into the lungs for wheezing or shortness of breath., Disp: 2 Inhaler, Rfl: 0 .  escitalopram (LEXAPRO) 5 MG tablet, Take 1 tablet (5 mg total) by mouth at bedtime., Disp: 90 tablet, Rfl: 2 .  fexofenadine (ALLEGRA) 180 MG tablet, Take 180 mg by mouth as needed for allergies or rhinitis., Disp: , Rfl:  .  fluticasone (FLONASE) 50 MCG/ACT nasal spray, Place 2 sprays into both nostrils daily as needed for allergies or rhinitis., Disp: 16 g, Rfl: 5 .  montelukast (SINGULAIR) 10 MG tablet, Take 1 tablet (10 mg total) by mouth at bedtime., Disp: 30 tablet, Rfl: 5  Medication Side Effects: none  Family Medical/ Social History: Changes? No, previously noting that maternal great grandfather likely had PTSD from the war, and paternal great grandfather had depression.  Maternal grandfather and maternal uncle had substance use with alcohol and mood problems possibly bipolar.  Maternal great aunt had alcohol use disorder, and maternal great great aunt completed suicide.   MENTAL HEALTH EXAM:  Height 5\' 7"  (1.702 m), weight 190 lb (86.2 kg).Body mass index is 29.76 kg/m. Muscle strengths and tone 5/5, postural reflexes and gait 0/0, and AIMS = 0.  General Appearance: Casual, Guarded, Meticulous and Well Groomed  Eye Contact:  Good  Speech:  Clear and Coherent and Normal Rate  Volume:  Normal  Mood:  Anxious and Euthymic  Affect:  Congruent, Inappropriate, Full Range and Anxious  Thought Process:  Coherent, Goal Directed, Irrelevant, Linear and Descriptions of Associations: Circumstantial  Orientation:  Full (Time, Place, and Person)  Thought Content: Obsessions and Rumination   Suicidal Thoughts:  No  Homicidal Thoughts:  No  Memory:  Immediate;   Good Remote;   Good  Judgement:  Fair  Insight:  Fair  Psychomotor Activity:  Normal and Mannerisms   Concentration:  Concentration: Fair and Attention Span: Fair  Recall:  of Knowledge: Good  Language: Good  Assets:  Desire for Improvement Leisure Time Social Support Talents/Skills Vocational/Educational  ADL's:  Intact  Cognition: WNL  Prognosis:  Fair to Good    DIAGNOSES:    ICD-10-CM   1. Generalized anxiety disorder  F41.1 escitalopram (LEXAPRO) 5 MG tablet  2. 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  His participation in student government at college may help along with the natural course of maturity in completing college toward transition to adult life.  The patient can likely complete college without the [employer] unless higher division courses become requiring of renewing at some point.  However he definitely has skills for mastering learning himself independent of the medication now.  Similarly, his anxiety is acutely modest though there are cumulative consequences he must continue to resolve.  [place] is discontinued.  Lexapro current supply 10 mg every bedtime is continued likely to February, and they wish the next eScription to be filled through [facility] for Lexapro 5 mg every bedtime for at least 1 month before reducing to  1/2 tablet total of 2.5 mg every bedtime for at least a month before discontinuation by mid spring E scribed #[phone] with 2 refills of the 5 mg tablet in case they do not discontinue the 5 mg dose.  He follows up with Corie Chiquito, PMHNP in 6 months.   Chauncey Mann, MD

## 2020-11-09 ENCOUNTER — Encounter: Payer: Self-pay | Admitting: Psychiatry

## 2021-01-08 ENCOUNTER — Telehealth: Payer: Self-pay | Admitting: Psychiatry

## 2021-01-08 DIAGNOSIS — F411 Generalized anxiety disorder: Secondary | ICD-10-CM

## 2021-01-08 MED ORDER — ESCITALOPRAM OXALATE 10 MG PO TABS: ORAL_TABLET | ORAL | 1 refills | Status: DC

## 2021-01-08 NOTE — Telephone Encounter (Signed)
Called Mom and LM on VM  Rx sent.

## 2021-01-08 NOTE — Telephone Encounter (Signed)
Pt's mom Perry Frank called to advise Perry Frank had given Rx for Escitalopram 5 mg for the Summer. Pt wants to taper off med during summer. [facility]. Cancelled refill for 10 mg 1/d and replaced with 5 mg Rx. Pt is @ school and mom leaving today @ 5pm. Asking if you would send new Rx Escitalopam 10 mg 1/d in ASAP. Mom will pick up and take to Pt. Apt 6/15

## 2021-05-06 ENCOUNTER — Ambulatory Visit (INDEPENDENT_AMBULATORY_CARE_PROVIDER_SITE_OTHER): Payer: 59 | Admitting: Behavioral Health

## 2021-05-06 ENCOUNTER — Other Ambulatory Visit: Payer: Self-pay

## 2021-05-06 ENCOUNTER — Ambulatory Visit: Payer: 59 | Admitting: Psychiatry

## 2021-05-06 ENCOUNTER — Encounter: Payer: Self-pay | Admitting: Behavioral Health

## 2021-05-06 DIAGNOSIS — F900 Attention-deficit hyperactivity disorder, predominantly inattentive type: Secondary | ICD-10-CM

## 2021-05-06 DIAGNOSIS — F411 Generalized anxiety disorder: Secondary | ICD-10-CM | POA: Diagnosis not present

## 2021-05-06 MED ORDER — ESCITALOPRAM OXALATE 5 MG PO TABS: 5.0000 mg | ORAL_TABLET | Freq: Every day | ORAL | 0 refills | Status: DC

## 2021-05-06 NOTE — Progress Notes (Signed)
[facility]  Patient ID: Kenroy Timberman,  MRN: [id number]  PCP: Maryellen Pile, MD  Date of Evaluation: 05/06/2021 Time spent:30 minutes  Chief Complaint:  Chief Complaint   Anxiety; ADHD; Depression; Follow-up     HISTORY/CURRENT STATUS: HPI 20 year old male patient presented to this office for follow up and medication management. His mother was present during interview with his consent. He says that he is basically her for yearly check in and to get refill of his Lexapro. He says that he has decided to try to wean off the medication over the summer while he is out of school. He has been on Lexapro since 9th grade. He reports that he is in good place in life to try to wean off medication. He reports 0 anxiety and 0 depression today. Reports getting 7-8 hours of sleep per night. No mania, no psychosis. No SI/HI.  No past psychiatric medication failures  Individual Medical History/ Review of Systems: Changes? :No   Allergies: Amoxicillin  Current Medications:  Current Outpatient Medications:    escitalopram (LEXAPRO) 10 MG tablet, TAKE 1 TABLET BY MOUTH EVERYDAY AT BEDTIME, Disp: 90 tablet, Rfl: 1   escitalopram (LEXAPRO) 5 MG tablet, Take 1 tablet (5 mg total) by mouth daily., Disp: 30 tablet, Rfl: 0   montelukast (SINGULAIR) 10 MG tablet, Take 1 tablet (10 mg total) by mouth at bedtime., Disp: 30 tablet, Rfl: 5   acetaminophen (TYLENOL) 325 MG tablet, Take 650 mg by mouth every 6 (six) hours as needed. (Patient not taking: Reported on 05/06/2021), Disp: , Rfl:    albuterol (PROVENTIL HFA;VENTOLIN HFA) 108 (90 Base) MCG/ACT inhaler, Inhale 2 puffs every 4 (four) hours as needed into the lungs for wheezing or shortness of breath. (Patient not taking: Reported on 05/06/2021), Disp: 2 Inhaler, Rfl: 0   fexofenadine (ALLEGRA) 180 MG tablet, Take 180 mg by mouth as needed for allergies or rhinitis. (Patient not taking: Reported on 05/06/2021), Disp: , Rfl:    fluticasone  (FLONASE) 50 MCG/ACT nasal spray, Place 2 sprays into both nostrils daily as needed for allergies or rhinitis. (Patient not taking: Reported on 05/06/2021), Disp: 16 g, Rfl: 5 Medication Side Effects: none  Family Medical/ Social History: Changes? No  MENTAL HEALTH EXAM:  There were no vitals taken for this visit.There is no height or weight on file to calculate BMI.  General Appearance: Casual, Neat, and Well Groomed  Eye Contact:  Good  Speech:  Clear and Coherent  Volume:  Normal  Mood:  NA  Affect:  Appropriate  Thought Process:  Coherent  Orientation:  Full (Time, Place, and Person)  Thought Content: Logical   Suicidal Thoughts:  No  Homicidal Thoughts:  No  Memory:  WNL  Judgement:  Good  Insight:  Good  Psychomotor Activity:  Normal  Concentration:  Concentration: Good  Recall:  Good  Fund of Knowledge: Good  Language: Good  Assets:  Desire for Improvement  ADL's:  Intact  Cognition: WNL  Prognosis:  Good    DIAGNOSES:    ICD-10-CM   1. Generalized anxiety disorder  F41.1 escitalopram (LEXAPRO) 5 MG tablet    2. 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 Then 2.5 mg for two weeks.  To report any worsening symptoms or changes in mood To follow up in 2 months  before returning to college for reassessment. Greater than 50% of 30 min. face to face time with patient was spent on counseling and coordination of care. We discussed patients history with anxiety and depression. Patient has mad the decision to try weaning off Lexapro over the summer while he is out of school.    Joan Flores, NP

## 2021-06-30 ENCOUNTER — Ambulatory Visit: Payer: 59 | Admitting: Behavioral Health

## 2021-07-24 ENCOUNTER — Other Ambulatory Visit: Payer: Self-pay | Admitting: Psychiatry

## 2021-07-24 DIAGNOSIS — F411 Generalized anxiety disorder: Secondary | ICD-10-CM

## 2021-10-01 ENCOUNTER — Other Ambulatory Visit: Payer: Self-pay | Admitting: Psychiatry

## 2021-10-01 DIAGNOSIS — F411 Generalized anxiety disorder: Secondary | ICD-10-CM

## 2021-10-02 ENCOUNTER — Other Ambulatory Visit: Payer: Self-pay | Admitting: Psychiatry

## 2021-10-02 DIAGNOSIS — F411 Generalized anxiety disorder: Secondary | ICD-10-CM

## 2021-10-26 ENCOUNTER — Other Ambulatory Visit: Payer: Self-pay | Admitting: Psychiatry

## 2021-10-26 DIAGNOSIS — F411 Generalized anxiety disorder: Secondary | ICD-10-CM

## 2021-11-10 ENCOUNTER — Encounter: Payer: Self-pay | Admitting: Behavioral Health

## 2021-11-10 ENCOUNTER — Ambulatory Visit (INDEPENDENT_AMBULATORY_CARE_PROVIDER_SITE_OTHER): Payer: 59 | Admitting: Behavioral Health

## 2021-11-10 ENCOUNTER — Other Ambulatory Visit: Payer: Self-pay

## 2021-11-10 DIAGNOSIS — F411 Generalized anxiety disorder: Secondary | ICD-10-CM

## 2021-11-10 DIAGNOSIS — F900 Attention-deficit hyperactivity disorder, predominantly inattentive type: Secondary | ICD-10-CM

## 2021-11-10 MED ORDER — ESCITALOPRAM OXALATE 10 MG PO TABS: ORAL_TABLET | ORAL | 0 refills | Status: DC

## 2021-11-10 NOTE — Progress Notes (Signed)
[facility]  Patient ID: Perry Frank,  MRN: [id number]  PCP: Karleen Dolphin, MD  Date of Evaluation: 11/10/2021 Time spent:20 minutes  Chief Complaint:  Chief Complaint   Anxiety; Follow-up; Medication Refill     HISTORY/CURRENT STATUS: HPI 20 year old male patient presented to this office for follow up and medication management. His mother was present during interview with his consent. He decided to not wean off Lexapro as discussed last visit. He says he forgot to do so. Says he continued on with Lexapro 10 mg. He does not want to makes changes and says he will try to wean off again next summer. He reports 0 anxiety and 0 depression today. Reports getting 7-8 hours of sleep per night. No mania, no psychosis. No SI/HI.   No past psychiatric medication failures  Individual Medical History/ Review of Systems: Changes? :No   Allergies: Amoxicillin  Current Medications:  Current Outpatient Medications:    acetaminophen (TYLENOL) 325 MG tablet, Take 650 mg by mouth every 6 (six) hours as needed. (Patient not taking: Reported on 05/06/2021), Disp: , Rfl:    albuterol (PROVENTIL HFA;VENTOLIN HFA) 108 (90 Base) MCG/ACT inhaler, Inhale 2 puffs every 4 (four) hours as needed into the lungs for wheezing or shortness of breath. (Patient not taking: Reported on 05/06/2021), Disp: 2 Inhaler, Rfl: 0   escitalopram (LEXAPRO) 10 MG tablet, Take one tablet daily., Disp: 90 tablet, Rfl: 0   fexofenadine (ALLEGRA) 180 MG tablet, Take 180 mg by mouth as needed for allergies or rhinitis. (Patient not taking: Reported on 05/06/2021), Disp: , Rfl:    fluticasone (FLONASE) 50 MCG/ACT nasal spray, Place 2 sprays into both nostrils daily as needed for allergies or rhinitis. (Patient not taking: Reported on 05/06/2021), Disp: 16 g, Rfl: 5   montelukast (SINGULAIR) 10 MG tablet, Take 1 tablet (10 mg total) by mouth at bedtime., Disp: 30 tablet, Rfl: 5 Medication Side Effects: none  Family  Medical/ Social History: Changes? No  MENTAL HEALTH EXAM:  There were no vitals taken for this visit.There is no height or weight on file to calculate BMI.  General Appearance: Casual and Neat  Eye Contact:  Good  Speech:  Clear and Coherent  Volume:  Normal  Mood:  NA  Affect:  Appropriate  Thought Process:  Coherent  Orientation:  Full (Time, Place, and Person)  Thought Content: Logical   Suicidal Thoughts:  No  Homicidal Thoughts:  No  Memory:  WNL  Judgement:  Good  Insight:  Good  Psychomotor Activity:  Normal  Concentration:  Concentration: Good  Recall:  Good  Fund of Knowledge: Good  Language: Good  Assets:  Desire for Improvement  ADL's:  Intact  Cognition: WNL  Prognosis:  Good    DIAGNOSES:    ICD-10-CM   1. 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 Generalized anxiety disorder  F41.1 escitalopram (LEXAPRO) 10 MG tablet      Receiving Psychotherapy: No    RECOMMENDATIONS:    Greater than 50% of 20 min. face to face time with patient was spent on counseling and coordination of care. We discussed his current stability with Lexapro. He does not want to make changes at this time. He has no questions or additional concerns this visit.  Pt says that he forgot to wean off Lexapro as discussed in last visit and remained on Lexapro 10 mg. He says he will try again next summer when he is out  of school.  To follow up in 6 months or 04/2022 when out for summer break. To report any worsening symptoms or changes in mood Provided emergency contact information Reviewed PDMP     Joan Flores, NP

## 2022-01-31 ENCOUNTER — Other Ambulatory Visit: Payer: Self-pay | Admitting: Psychiatry

## 2022-01-31 DIAGNOSIS — F411 Generalized anxiety disorder: Secondary | ICD-10-CM

## 2022-05-11 ENCOUNTER — Ambulatory Visit: Payer: 59 | Admitting: Behavioral Health

## 2022-05-15 ENCOUNTER — Other Ambulatory Visit: Payer: Self-pay | Admitting: Behavioral Health

## 2022-05-15 DIAGNOSIS — F411 Generalized anxiety disorder: Secondary | ICD-10-CM

## 2022-05-30 ENCOUNTER — Other Ambulatory Visit: Payer: Self-pay | Admitting: Behavioral Health

## 2022-05-30 DIAGNOSIS — F411 Generalized anxiety disorder: Secondary | ICD-10-CM

## 2022-06-28 ENCOUNTER — Ambulatory Visit (INDEPENDENT_AMBULATORY_CARE_PROVIDER_SITE_OTHER): Payer: Self-pay | Admitting: Behavioral Health

## 2022-06-28 DIAGNOSIS — F489 Nonpsychotic mental disorder, unspecified: Secondary | ICD-10-CM

## 2022-06-28 NOTE — Progress Notes (Signed)
Pt did not show for scheduled appointment and did not provide 24 hour notice as required. Fee to be assessed.

## 2022-06-29 ENCOUNTER — Ambulatory Visit: Payer: 59 | Admitting: Behavioral Health

## 2022-08-30 ENCOUNTER — Telehealth: Payer: Self-pay | Admitting: Behavioral Health

## 2022-08-30 NOTE — Telephone Encounter (Signed)
Mom called and said that [place] just picked up his last refill on his lexapro. So he will need one for November and and December. He has an appointment on 12/18. [facility] [place]  on [address]

## 2022-08-31 ENCOUNTER — Other Ambulatory Visit: Payer: Self-pay

## 2022-08-31 DIAGNOSIS — F411 Generalized anxiety disorder: Secondary | ICD-10-CM

## 2022-08-31 MED ORDER — ESCITALOPRAM OXALATE 10 MG PO TABS: ORAL_TABLET | ORAL | 1 refills | Status: DC

## 2022-08-31 NOTE — Telephone Encounter (Signed)
Rx sent 

## 2022-10-15 ENCOUNTER — Other Ambulatory Visit: Payer: Self-pay | Admitting: Behavioral Health

## 2022-10-15 DIAGNOSIS — F411 Generalized anxiety disorder: Secondary | ICD-10-CM

## 2022-11-08 ENCOUNTER — Ambulatory Visit: Payer: 59 | Admitting: Behavioral Health

## 2022-11-24 ENCOUNTER — Ambulatory Visit (INDEPENDENT_AMBULATORY_CARE_PROVIDER_SITE_OTHER): Payer: 59 | Admitting: Behavioral Health

## 2022-11-24 ENCOUNTER — Encounter: Payer: Self-pay | Admitting: Behavioral Health

## 2022-11-24 DIAGNOSIS — F411 Generalized anxiety disorder: Secondary | ICD-10-CM

## 2022-11-24 MED ORDER — ESCITALOPRAM OXALATE 10 MG PO TABS: ORAL_TABLET | ORAL | 1 refills | Status: DC

## 2022-11-24 NOTE — Progress Notes (Signed)
[facility]  Patient ID: Perry Frank,  MRN: [id number]  PCP: Karleen Dolphin, MD (Inactive)  Date of Evaluation: 11/24/2022 Time spent:30 minutes  Chief Complaint:  Chief Complaint   Depression; Anxiety; Follow-up; Medication Refill; Patient Education     HISTORY/CURRENT STATUS: HPI  Individual Medical History/ Review of Systems: Changes? :No   Allergies: Amoxicillin  Current Medications:  Current Outpatient Medications:    acetaminophen (TYLENOL) 325 MG tablet, Take 650 mg by mouth every 6 (six) hours as needed. (Patient not taking: Reported on 05/06/2021), Disp: , Rfl:    albuterol (PROVENTIL HFA;VENTOLIN HFA) 108 (90 Base) MCG/ACT inhaler, Inhale 2 puffs every 4 (four) hours as needed into the lungs for wheezing or shortness of breath. (Patient not taking: Reported on 05/06/2021), Disp: 2 Inhaler, Rfl: 0   escitalopram (LEXAPRO) 10 MG tablet, Take one capsule daily in the morning after breakfast., Disp: 90 tablet, Rfl: 1   fexofenadine (ALLEGRA) 180 MG tablet, Take 180 mg by mouth as needed for allergies or rhinitis. (Patient not taking: Reported on 05/06/2021), Disp: , Rfl:    fluticasone (FLONASE) 50 MCG/ACT nasal spray, Place 2 sprays into both nostrils daily as needed for allergies or rhinitis. (Patient not taking: Reported on 05/06/2021), Disp: 16 g, Rfl: 5   montelukast (SINGULAIR) 10 MG tablet, Take 1 tablet (10 mg total) by mouth at bedtime., Disp: 30 tablet, Rfl: 5 Medication Side Effects: none  Family Medical/ Social History: Changes? No  MENTAL HEALTH EXAM:  There were no vitals taken for this visit.There is no height or weight on file to calculate BMI.  General Appearance: Casual, Neat, and Well Groomed  Eye Contact:  Good  Speech:  Clear and Coherent  Volume:  Normal  Mood:  NA  Affect:  Appropriate  Thought Process:  Coherent  Orientation:  Full (Time, Place, and Person)  Thought Content: Logical   Suicidal Thoughts:  No  Homicidal Thoughts:   No  Memory:  WNL  Judgement:  Good  Insight:  Good  Psychomotor Activity:  Normal  Concentration:  Concentration: Good  Recall:  Good  Fund of Knowledge: Good  Language: Good  Assets:  Desire for Improvement  ADL's:  Intact  Cognition: WNL  Prognosis:  Good    DIAGNOSES:    ICD-10-CM   1. Generalized anxiety disorder  F41.1 escitalopram (LEXAPRO) 10 MG tablet      Receiving Psychotherapy: No    RECOMMENDATIONS:   Greater than 50% of 30 min. face to face time with patient was spent on counseling and coordination of care. We discussed his current stability with Lexapro. He does not want to make changes at this time. He has no questions or additional concerns this visit.   He would like to try to wean off Lexapro after he graduated this summer.  Continue Lexapro 10 mg daily To follow up in 5  months after he graduates To report any worsening symptoms or changes in mood Provided emergency contact information Reviewed [place], NP

## 2023-06-28 ENCOUNTER — Encounter: Payer: Self-pay | Admitting: Behavioral Health

## 2023-06-28 ENCOUNTER — Ambulatory Visit (INDEPENDENT_AMBULATORY_CARE_PROVIDER_SITE_OTHER): Payer: 59 | Admitting: Behavioral Health

## 2023-06-28 DIAGNOSIS — F411 Generalized anxiety disorder: Secondary | ICD-10-CM

## 2023-06-28 MED ORDER — ESCITALOPRAM OXALATE 10 MG PO TABS: ORAL_TABLET | ORAL | 1 refills | Status: DC

## 2023-06-28 NOTE — Progress Notes (Signed)
[facility]  Patient ID: Perry Frank,  MRN: [id number]  PCP: Maryellen Pile, MD (Inactive)  Date of Evaluation: 06/28/2023 Time spent:30 minutes  Chief Complaint:  Chief Complaint   Anxiety; Depression; Follow-up; Patient Education; Medication Refill     HISTORY/CURRENT STATUS: HPI 22 year old male patient presented to this office for follow up and medication management. Says that he graduated [employer] in May. Now waiting to start work.  Says he will continue with Lexapro for now.  He reports 0 anxiety and 0 depression today. Reports getting 7-8 hours of sleep per night. No mania, no psychosis. No SI/HI.   No past psychiatric medication failures Individual Medical History/ Review of Systems: Changes? :No   Allergies: Amoxicillin  Current Medications:  Current Outpatient Medications:    acetaminophen (TYLENOL) 325 MG tablet, Take 650 mg by mouth every 6 (six) hours as needed. (Patient not taking: Reported on 05/06/2021), Disp: , Rfl:    albuterol (PROVENTIL HFA;VENTOLIN HFA) 108 (90 Base) MCG/ACT inhaler, Inhale 2 puffs every 4 (four) hours as needed into the lungs for wheezing or shortness of breath. (Patient not taking: Reported on 05/06/2021), Disp: 2 Inhaler, Rfl: 0   escitalopram (LEXAPRO) 10 MG tablet, Take one capsule daily in the morning after breakfast., Disp: 90 tablet, Rfl: 1   fexofenadine (ALLEGRA) 180 MG tablet, Take 180 mg by mouth as needed for allergies or rhinitis. (Patient not taking: Reported on 05/06/2021), Disp: , Rfl:    fluticasone (FLONASE) 50 MCG/ACT nasal spray, Place 2 sprays into both nostrils daily as needed for allergies or rhinitis. (Patient not taking: Reported on 05/06/2021), Disp: 16 g, Rfl: 5   montelukast (SINGULAIR) 10 MG tablet, Take 1 tablet (10 mg total) by mouth at bedtime., Disp: 30 tablet, Rfl: 5 Medication Side Effects: none  Family Medical/ Social History: Changes? No  MENTAL HEALTH EXAM:  There were no vitals  taken for this visit.There is no height or weight on file to calculate BMI.  General Appearance: Casual and Neat  Eye Contact:  Good  Speech:  Clear and Coherent  Volume:  Normal  Mood:  Anxious  Affect:  Appropriate  Thought Process:  Coherent  Orientation:  Full (Time, Place, and Person)  Thought Content: Logical   Suicidal Thoughts:  No  Homicidal Thoughts:  No  Memory:  WNL  Judgement:  Good  Insight:  Good  Psychomotor Activity:  Normal  Concentration:  Concentration: Good  Recall:  Good  Fund of Knowledge: Good  Language: Good  Assets:  Desire for Improvement  ADL's:  Intact  Cognition: WNL  Prognosis:  Good    DIAGNOSES:    ICD-10-CM   1. Generalized anxiety disorder  F41.1 escitalopram (LEXAPRO) 10 MG tablet      Receiving Psychotherapy: No    RECOMMENDATIONS:    Greater than 50% of 30 min. face to face time with patient was spent on counseling and coordination of care. We discussed his current stability with Lexapro. He does not want to make changes at this time. He has no questions or additional concerns this visit.  We agreed to: Continue Lexapro 10 mg daily To follow up in 6  months to reassess To report any worsening symptoms or changes in mood Provided emergency contact information Reviewed PDMP           Joan Flores, NP

## 2023-10-05 ENCOUNTER — Encounter: Payer: Self-pay | Admitting: Psychiatry

## 2023-12-29 ENCOUNTER — Ambulatory Visit: Payer: 59 | Admitting: Behavioral Health

## 2023-12-29 DIAGNOSIS — Z0389 Encounter for observation for other suspected diseases and conditions ruled out: Secondary | ICD-10-CM

## 2023-12-29 NOTE — Progress Notes (Signed)
 Pt did not show for scheduled appt and did not provide 24 hour notice as required. Additional fees to be assessed.

## 2024-02-21 ENCOUNTER — Encounter: Payer: Self-pay | Admitting: Behavioral Health

## 2024-02-21 ENCOUNTER — Ambulatory Visit: Admitting: Behavioral Health

## 2024-02-21 DIAGNOSIS — F411 Generalized anxiety disorder: Secondary | ICD-10-CM

## 2024-02-21 MED ORDER — ESCITALOPRAM OXALATE 10 MG PO TABS: ORAL_TABLET | ORAL | 1 refills | Status: DC

## 2024-02-21 NOTE — Progress Notes (Signed)
 [facility]  Patient ID: Perry Frank,  MRN: [id number]  PCP: Maryellen Pile, MD (Inactive)  Date of Evaluation: 02/21/2024 Time spent:30 minutes  Chief Complaint:  Chief Complaint   Depression; Anxiety; Follow-up; Patient Education; Medication Refill     HISTORY/CURRENT STATUS: HPI 23 year old male patient presented to this office for follow up and medication management. Says he is now working for [employer] in [employer]. Things are going well.Fredna Dow he will continue with Lexapro for now.  He reports 0 anxiety and 0 depression today. Reports getting 7-8 hours of sleep per night. No mania, no psychosis. No SI/HI.   No past psychiatric medication failures    Individual Medical History/ Review of Systems: Changes? :No   Allergies: Amoxicillin  Current Medications:  Current Outpatient Medications:    acetaminophen (TYLENOL) 325 MG tablet, Take 650 mg by mouth every 6 (six) hours as needed. (Patient not taking: Reported on 05/06/2021), Disp: , Rfl:    albuterol (PROVENTIL HFA;VENTOLIN HFA) 108 (90 Base) MCG/ACT inhaler, Inhale 2 puffs every 4 (four) hours as needed into the lungs for wheezing or shortness of breath. (Patient not taking: Reported on 05/06/2021), Disp: 2 Inhaler, Rfl: 0   escitalopram (LEXAPRO) 10 MG tablet, Take one capsule daily in the morning after breakfast., Disp: 90 tablet, Rfl: 1   fexofenadine (ALLEGRA) 180 MG tablet, Take 180 mg by mouth as needed for allergies or rhinitis. (Patient not taking: Reported on 05/06/2021), Disp: , Rfl:    fluticasone (FLONASE) 50 MCG/ACT nasal spray, Place 2 sprays into both nostrils daily as needed for allergies or rhinitis. (Patient not taking: Reported on 05/06/2021), Disp: 16 g, Rfl: 5   montelukast (SINGULAIR) 10 MG tablet, Take 1 tablet (10 mg total) by mouth at bedtime., Disp: 30 tablet, Rfl: 5 Medication Side Effects: none  Family Medical/ Social History: Changes? No  MENTAL HEALTH EXAM:  There were no  vitals taken for this visit.There is no height or weight on file to calculate BMI.  General Appearance: Casual and Neat  Eye Contact:  Good  Speech:  Clear and Coherent  Volume:  Normal  Mood:  NA  Affect:  Appropriate  Thought Process:  Coherent  Orientation:  Full (Time, Place, and Person)  Thought Content: Logical   Suicidal Thoughts:  No  Homicidal Thoughts:  No  Memory:  WNL  Judgement:  Good  Insight:  Good  Psychomotor Activity:  Normal  Concentration:  Concentration: Good  Recall:  Good  Fund of Knowledge: Good  Language: Good  Assets:  Desire for Improvement  ADL's:  Intact  Cognition: WNL  Prognosis:  Good    DIAGNOSES:    ICD-10-CM   1. Generalized anxiety disorder  F41.1       Receiving Psychotherapy: No    RECOMMENDATIONS:  Greater than 50% of 30 min. face to face time with patient was spent on counseling and coordination of care. We discussed his current stability with Lexapro. He does not want to make changes at this time. He has no questions or additional concerns this visit. He would like to consider weaning off the medication when he settles in on his job. Takes medications as prescribed.  We agreed to: Continue Lexapro 10 mg daily To follow up in 6  months to reassess To report any worsening symptoms or changes in mood Provided emergency contact information Reviewed PDMP      Joan Flores, NP

## 2024-08-22 ENCOUNTER — Encounter: Payer: Self-pay | Admitting: Behavioral Health

## 2024-08-22 ENCOUNTER — Ambulatory Visit (INDEPENDENT_AMBULATORY_CARE_PROVIDER_SITE_OTHER): Admitting: Behavioral Health

## 2024-08-22 DIAGNOSIS — F411 Generalized anxiety disorder: Secondary | ICD-10-CM

## 2024-08-22 MED ORDER — ESCITALOPRAM OXALATE 10 MG PO TABS: ORAL_TABLET | ORAL | 1 refills | Status: DC

## 2024-08-22 NOTE — Progress Notes (Signed)
 [facility]  Patient ID: Perry Frank,  MRN: [id number]  PCP: Melodye Lenis, MD (Inactive)  Date of Evaluation: 08/22/2024 Time spent:20 minutes  Chief Complaint:   HISTORY/CURRENT STATUS: HPI 23 year old male patient presented to this office for follow up and medication management. Says he is now working for [employer] in [employer]. Job is going well.  Says he will continue with Lexapro  for now. He would like to consider continuing to wean of medication in February or early spring.  He reports 0 anxiety and 0 depression today. Reports getting 7-8 hours of sleep per night. No mania, no psychosis. No SI/HI.   No past psychiatric medication failures        Individual Medical History/ Review of Systems: Changes? :No   Allergies: Amoxicillin  Current Medications:  Current Outpatient Medications:    acetaminophen (TYLENOL) 325 MG tablet, Take 650 mg by mouth every 6 (six) hours as needed. (Patient not taking: Reported on 05/06/2021), Disp: , Rfl:    albuterol  (PROVENTIL  HFA;VENTOLIN  HFA) 108 (90 Base) MCG/ACT inhaler, Inhale 2 puffs every 4 (four) hours as needed into the lungs for wheezing or shortness of breath. (Patient not taking: Reported on 05/06/2021), Disp: 2 Inhaler, Rfl: 0   escitalopram  (LEXAPRO ) 10 MG tablet, Take one capsule daily in the morning after breakfast., Disp: 90 tablet, Rfl: 1   fexofenadine (ALLEGRA) 180 MG tablet, Take 180 mg by mouth as needed for allergies or rhinitis. (Patient not taking: Reported on 05/06/2021), Disp: , Rfl:    fluticasone  (FLONASE ) 50 MCG/ACT nasal spray, Place 2 sprays into both nostrils daily as needed for allergies or rhinitis. (Patient not taking: Reported on 05/06/2021), Disp: 16 g, Rfl: 5   montelukast  (SINGULAIR ) 10 MG tablet, Take 1 tablet (10 mg total) by mouth at bedtime., Disp: 30 tablet, Rfl: 5 Medication Side Effects: none  Family Medical/ Social History: Changes? No  MENTAL HEALTH EXAM:  There were no vitals  taken for this visit.There is no height or weight on file to calculate BMI.  General Appearance: Casual, Neat, and Well Groomed  Eye Contact:  Good  Speech:  Clear and Coherent  Volume:  Normal  Mood:  NA  Affect:  Appropriate  Thought Process:  Coherent  Orientation:  Full (Time, Place, and Person)  Thought Content: Logical   Suicidal Thoughts:  No  Homicidal Thoughts:  No  Memory:  WNL  Judgement:  Good  Insight:  Good  Psychomotor Activity:  Normal  Concentration:  Concentration: Good  Recall:  Good  Fund of Knowledge: Good  Language: Good  Assets:  Desire for Improvement  ADL's:  Intact  Cognition: WNL  Prognosis:  Good    DIAGNOSES:    ICD-10-CM   1. Generalized anxiety disorder  F41.1 escitalopram  (LEXAPRO ) 10 MG tablet      Receiving Psychotherapy: No    RECOMMENDATIONS:   Greater than 50% of 20 min. face to face time with patient was spent on counseling and coordination of care. We discussed his current stability with Lexapro . He does not want to make changes at this time. He has no questions or additional concerns this visit. He would like to consider weaning off the medication when he settles in on his job. Takes medications as prescribed.  We agreed to: Continue Lexapro  10 mg daily To follow up in 4  months to reassess To report any worsening symptoms or changes in mood Provided emergency contact information Reviewed PDMP       Redell  DELENA Pizza, NP

## 2024-12-26 ENCOUNTER — Ambulatory Visit: Admitting: Behavioral Health

## 2024-12-26 ENCOUNTER — Encounter: Payer: Self-pay | Admitting: Behavioral Health

## 2024-12-26 DIAGNOSIS — F411 Generalized anxiety disorder: Secondary | ICD-10-CM

## 2024-12-26 MED ORDER — ESCITALOPRAM OXALATE 10 MG PO TABS: ORAL_TABLET | ORAL | 1 refills | Status: AC

## 2024-12-26 NOTE — Progress Notes (Signed)
 "     [facility]  Patient ID: Perry Frank,  MRN: [id number]  PCP: Melodye Lenis, MD (Inactive)  Date of Evaluation: 12/26/2024 Time spent:30 minutes  Chief Complaint:  Chief Complaint   Anxiety; Depression; Follow-up; Patient Education; Medication Refill     HISTORY/CURRENT STATUS: HPI Perry Frank, 24 year old male patient presented to this office for follow up and medication management.  Smiling and pleasant today.  [place] he thought he was getting sick but realized he ran out of medication this past Saturday.  Needing refills.  He thinks he has decided that he would like to try to wean off Lexapro  and will start reducing his dose soon.  Agrees to notify this office when he chooses to do so.  Reports that he still enjoying his job.  He reports 0 anxiety and 0 depression today. Reports getting 7-8 hours of sleep per night.  Appetite is good no mania, no psychosis. No SI/HI.   No past psychiatric medication failures         Individual Medical History/ Review of Systems: Changes? :No   Allergies: Amoxicillin  Current Medications: Current Medications[1] Medication Side Effects: none  Family Medical/ Social History: Changes? No  MENTAL HEALTH EXAM:  There were no vitals taken for this visit.There is no height or weight on file to calculate BMI.  General Appearance: Casual and Well Groomed  Eye Contact:  Good  Speech:  Clear and Coherent  Volume:  Normal  Mood:  NA  Affect:  Appropriate  Thought Process:  Coherent  Orientation:  Full (Time, Place, and Person)  Thought Content: Logical   Suicidal Thoughts:  No  Homicidal Thoughts:  No  Memory:  WNL  Judgement:  Good  Insight:  Good  Psychomotor Activity:  Normal  Concentration:  Concentration: Good  Recall:  Good  Fund of Knowledge: Good  Language: Good  Assets:  Desire for Improvement  ADL's:  Intact  Cognition: WNL  Prognosis:  Good    DIAGNOSES:    ICD-10-CM   1. Generalized anxiety disorder  F41.1  escitalopram  (LEXAPRO ) 10 MG tablet      Receiving Psychotherapy: No    RECOMMENDATIONS:   Greater than 50% of 20 min. face to face time with patient was spent on counseling and coordination of care. We discussed his current stability with Lexapro .  We discussed his desire to wean off medications within the next month or 2.  He agrees to contact this office if he starts the process.  Reviewed his medication and educated patient to reduce his dose while reducing risk of relapse or withdrawal-like symptoms. We agreed to: Continue Lexapro  10 mg daily To follow up in 6  months to reassess To report any worsening symptoms or changes in mood Provided emergency contact information Reviewed PDMP     Redell DELENA Pizza, NP     [1]  Current Outpatient Medications:    acetaminophen (TYLENOL) 325 MG tablet, Take 650 mg by mouth every 6 (six) hours as needed. (Patient not taking: Reported on 05/06/2021), Disp: , Rfl:    albuterol  (PROVENTIL  HFA;VENTOLIN  HFA) 108 (90 Base) MCG/ACT inhaler, Inhale 2 puffs every 4 (four) hours as needed into the lungs for wheezing or shortness of breath. (Patient not taking: Reported on 05/06/2021), Disp: 2 Inhaler, Rfl: 0   escitalopram  (LEXAPRO ) 10 MG tablet, Take one capsule daily in the morning after breakfast., Disp: 90 tablet, Rfl: 1   fexofenadine (ALLEGRA) 180 MG tablet, Take 180 mg by mouth as  needed for allergies or rhinitis. (Patient not taking: Reported on 05/06/2021), Disp: , Rfl:    fluticasone  (FLONASE ) 50 MCG/ACT nasal spray, Place 2 sprays into both nostrils daily as needed for allergies or rhinitis. (Patient not taking: Reported on 05/06/2021), Disp: 16 g, Rfl: 5   montelukast  (SINGULAIR ) 10 MG tablet, Take 1 tablet (10 mg total) by mouth at bedtime., Disp: 30 tablet, Rfl: 5  "

## 2025-06-25 ENCOUNTER — Ambulatory Visit: Admitting: Behavioral Health
